# Patient Record
Sex: Male | Born: 1980 | Hispanic: Yes | Marital: Single | Smoking: Never smoker
Health system: Southern US, Community
[De-identification: ages and names within clinical notes are randomized; demographics above are authoritative.]

## PROBLEM LIST (undated history)

## (undated) DIAGNOSIS — K219 Gastro-esophageal reflux disease without esophagitis: Secondary | ICD-10-CM

## (undated) HISTORY — PX: FOOT SURGERY: SHX648

## (undated) HISTORY — PX: KNEE SURGERY: SHX244

---

## 2019-12-07 ENCOUNTER — Encounter: Payer: Self-pay | Admitting: *Deleted

## 2020-01-25 ENCOUNTER — Ambulatory Visit (INDEPENDENT_AMBULATORY_CARE_PROVIDER_SITE_OTHER): Payer: 59 | Admitting: Gastroenterology

## 2020-01-25 ENCOUNTER — Encounter (INDEPENDENT_AMBULATORY_CARE_PROVIDER_SITE_OTHER): Payer: Self-pay

## 2020-01-25 ENCOUNTER — Other Ambulatory Visit: Payer: Self-pay

## 2020-01-25 VITALS — BP 116/76 | HR 64 | Temp 97.8°F | Wt 178.6 lb

## 2020-01-25 DIAGNOSIS — R1310 Dysphagia, unspecified: Secondary | ICD-10-CM

## 2020-01-25 MED ORDER — OMEPRAZOLE 40 MG PO CPDR: 40.0000 mg | DELAYED_RELEASE_CAPSULE | Freq: Every day | ORAL | 0 refills | Status: AC

## 2020-01-25 NOTE — Progress Notes (Signed)
   Wyline Mood MD, MRCP(U.K) [address]  [address]  [place], [place] [address]  Main: [phone]  Fax: [phone]   Gastroenterology Consultation  Referring Provider:     Katherina Right, MD Primary Care Physician:  Katherina Right, MD Primary Gastroenterologist:  Dr. Wyline Mood  Reason for Consultation:     Dysphagia         HPI:   Donald Mcguire is a 39 y.o. y/o male referred for consultation & management  by Dr. Katherina Right, MD.    Here with an interpreter as he speaks only Spanish.  States that he has been having difficulty swallowing for over 2 years for solids more than liquids.  Points to the upper part of his chest where food gets stuck almost every time.  Denies any prior evaluation for the same.  Gradually got worse over the period of 2 years.  Denies any heartburn.  Not on any PPI.  No family history of esophageal cancer.  Not a smoker.  No past medical history on file.    Prior to Admission medications   Not on File    No family history on file.   Social History   Tobacco Use  . Smoking status: Not on file  Substance Use Topics  . Alcohol use: Not on file  . Drug use: Not on file    Allergies as of 01/25/2020  . (No Known Allergies)    Review of Systems:    All systems reviewed and negative except where noted in HPI.   Physical Exam:  BP 116/76   Pulse 64   Temp 97.8 F (36.6 C)   Wt 178 lb 9.6 oz (81 kg)  No LMP for male patient. Psych:  Alert and cooperative. Normal mood and affect. General:   Alert,  Well-developed, well-nourished, pleasant and cooperative in NAD Head:  Normocephalic and atraumatic. Eyes:  Sclera clear, no icterus.   Conjunctiva pink. Ears:  Normal auditory acuity. Lungs:  Respirations even and unlabored.  Clear throughout to auscultation.   No wheezes, crackles, or rhonchi. No acute distress. Heart:  Regular rate and rhythm; no murmurs, clicks, rubs, or gallops. Abdomen:  Normal bowel sounds.  No bruits.  Soft,  non-tender and non-distended without masses, hepatosplenomegaly or hernias noted.  No guarding or rebound tenderness.    Neurologic:  Alert and oriented x3;  grossly normal neurologically. Psych:  Alert and cooperative. Normal mood and affect.  Imaging Studies: No results found.  Assessment and Plan:   Donald Mcguire is a 39 y.o. y/o male has been referred for dysphagia.  Plan 1.  Commence on Prilosec temporarily treat and have the reflux 2.  EGD  I have discussed alternative options, risks & benefits,  which include, but are not limited to, bleeding, infection, perforation,respiratory complication & drug reaction.  The patient agrees with this plan & written consent will be obtained.     Follow up in 6-8 weeks  Dr Wyline Mood MD,MRCP(U.K)

## 2020-01-25 NOTE — Addendum Note (Signed)
Addended by: Daisy Blossom on: 01/25/2020 02:31 PM   Modules accepted: Orders

## 2020-01-29 ENCOUNTER — Other Ambulatory Visit
Admission: RE | Admit: 2020-01-29 | Discharge: 2020-01-29 | Disposition: A | Discharge status: Home / Self Care | Payer: 59 | Source: Ambulatory Visit | Attending: Gastroenterology | Admitting: Gastroenterology

## 2020-01-29 DIAGNOSIS — Z20822 Contact with and (suspected) exposure to covid-19: Secondary | ICD-10-CM | POA: Insufficient documentation

## 2020-01-29 DIAGNOSIS — Z01812 Encounter for preprocedural laboratory examination: Secondary | ICD-10-CM | POA: Diagnosis not present

## 2020-01-29 LAB — SARS CORONAVIRUS 2 (TAT 6-24 HRS): SARS Coronavirus 2: NEGATIVE

## 2020-02-02 ENCOUNTER — Encounter
Admission: RE | Disposition: A | Discharge status: Home / Self Care | Payer: Self-pay | Source: Home / Self Care | Attending: Gastroenterology

## 2020-02-02 ENCOUNTER — Ambulatory Visit: Payer: 59 | Admitting: Anesthesiology

## 2020-02-02 ENCOUNTER — Encounter: Payer: Self-pay | Admitting: Gastroenterology

## 2020-02-02 ENCOUNTER — Other Ambulatory Visit: Payer: Self-pay

## 2020-02-02 ENCOUNTER — Ambulatory Visit
Admission: RE | Admit: 2020-02-02 | Discharge: 2020-02-02 | Disposition: A | Discharge status: Home / Self Care | Payer: 59 | Attending: Gastroenterology | Admitting: Gastroenterology

## 2020-02-02 DIAGNOSIS — Z79899 Other long term (current) drug therapy: Secondary | ICD-10-CM | POA: Insufficient documentation

## 2020-02-02 DIAGNOSIS — R1310 Dysphagia, unspecified: Secondary | ICD-10-CM | POA: Diagnosis not present

## 2020-02-02 DIAGNOSIS — K219 Gastro-esophageal reflux disease without esophagitis: Secondary | ICD-10-CM | POA: Insufficient documentation

## 2020-02-02 DIAGNOSIS — K222 Esophageal obstruction: Secondary | ICD-10-CM | POA: Diagnosis not present

## 2020-02-02 DIAGNOSIS — K449 Diaphragmatic hernia without obstruction or gangrene: Secondary | ICD-10-CM | POA: Diagnosis not present

## 2020-02-02 HISTORY — PX: ESOPHAGOGASTRODUODENOSCOPY (EGD) WITH PROPOFOL: SHX5813

## 2020-02-02 HISTORY — DX: Gastro-esophageal reflux disease without esophagitis: K21.9

## 2020-02-02 SURGERY — ESOPHAGOGASTRODUODENOSCOPY (EGD) WITH PROPOFOL
Anesthesia: General

## 2020-02-02 MED ORDER — SODIUM CHLORIDE 0.9 % IV SOLN: INTRAVENOUS | Status: DC

## 2020-02-02 MED ORDER — PROPOFOL 10 MG/ML IV BOLUS
INTRAVENOUS | Status: DC | PRN
  Administered 2020-02-02: 50 mg via INTRAVENOUS
  Administered 2020-02-02: 60 mg via INTRAVENOUS
  Administered 2020-02-02: 150 ug/kg/min via INTRAVENOUS

## 2020-02-02 MED ORDER — PROPOFOL 500 MG/50ML IV EMUL
INTRAVENOUS | Status: AC
  Filled 2020-02-02: qty 50

## 2020-02-02 NOTE — Anesthesia Preprocedure Evaluation (Signed)
Anesthesia Evaluation  Patient identified by MRN, date of birth, ID band Patient awake    Reviewed: Allergy & Precautions, NPO status , Patient's Chart, lab work & pertinent test results  History of Anesthesia Complications Negative for: history of anesthetic complications  Airway Mallampati: II  TM Distance: >3 FB Neck ROM: Full    Dental  (+) Implants   Pulmonary neg pulmonary ROS, neg sleep apnea, neg COPD,    breath sounds clear to auscultation- rhonchi (-) wheezing      Cardiovascular Exercise Tolerance: Good (-) hypertension(-) CAD and (-) Past MI  Rhythm:Regular Rate:Normal - Systolic murmurs and - Diastolic murmurs    Neuro/Psych negative neurological ROS  negative psych ROS   GI/Hepatic Neg liver ROS, GERD  ,  Endo/Other  negative endocrine ROSneg diabetes  Renal/GU negative Renal ROS     Musculoskeletal negative musculoskeletal ROS (+)   Abdominal (+) - obese,   Peds  Hematology negative hematology ROS (+)   Anesthesia Other Findings   Reproductive/Obstetrics                             Anesthesia Physical Anesthesia Plan  ASA: II  Anesthesia Plan: General   Post-op Pain Management:    Induction: Intravenous  PONV Risk Score and Plan: 1 and Propofol infusion  Airway Management Planned: Natural Airway  Additional Equipment:   Intra-op Plan:   Post-operative Plan:   Informed Consent: I have reviewed the patients History and Physical, chart, labs and discussed the procedure including the risks, benefits and alternatives for the proposed anesthesia with the patient or authorized representative who has indicated his/her understanding and acceptance.     Dental advisory given  Plan Discussed with: CRNA and Anesthesiologist  Anesthesia Plan Comments:         Anesthesia Quick Evaluation

## 2020-02-02 NOTE — Transfer of Care (Signed)
Immediate Anesthesia Transfer of Care Note  Patient: Donald Mcguire  Procedure(s) Performed: ESOPHAGOGASTRODUODENOSCOPY (EGD) WITH PROPOFOL (N/A )  Patient Location: PACU  Anesthesia Type:General  Level of Consciousness: drowsy  Airway & Oxygen Therapy: Patient Spontanous Breathing  Post-op Assessment: Report given to RN and Post -op Vital signs reviewed and stable  Post vital signs: Reviewed and stable  Last Vitals:  Vitals Value Taken Time  BP 98/61 02/02/20 0942  Temp 36 C 02/02/20 0940  Pulse 85 02/02/20 0942  Resp 13 02/02/20 0942  SpO2 98 % 02/02/20 0942  Vitals shown include unvalidated device data.  Last Pain:  Vitals:   02/02/20 0940  TempSrc: Temporal         Complications: No apparent anesthesia complications

## 2020-02-02 NOTE — H&P (Signed)
Wyline Mood, MD [address], [address], [place], [place], [address] [address], [place], [place], [address] Phone: [phone]  Fax: [phone]  Primary Care Physician:  [facility]   Pre-Procedure History & Physical: HPI:  Donald Mcguire is a 39 y.o. male is here for an endoscopy    Past Medical History:  Diagnosis Date  . GERD (gastroesophageal reflux disease)     Past Surgical History:  Procedure Laterality Date  . FOOT SURGERY Left   . KNEE SURGERY Left     Prior to Admission medications   Medication Sig Start Date End Date Taking? Authorizing Provider  omeprazole (PRILOSEC) 40 MG capsule Take 1 capsule (40 mg total) by mouth daily. 01/25/20  Yes Wyline Mood, MD  famotidine (PEPCID) 20 MG tablet Take 20 mg by mouth daily.    [provider]    Allergies as of 01/26/2020  . (No Known Allergies)    History reviewed. No pertinent family history.  Social History   Socioeconomic History  . Marital status: Single    Spouse name: Not on file  . Number of children: Not on file  . Years of education: Not on file  . Highest education level: Not on file  Occupational History  . Not on file  Tobacco Use  . Smoking status: Never Smoker  . Smokeless tobacco: Never Used  Substance and Sexual Activity  . Alcohol use: Yes    Comment: none last 24 hrs  . Drug use: Never  . Sexual activity: Not on file  Other Topics Concern  . Not on file  Social History Narrative  . Not on file   Social Determinants of Health   Financial Resource Strain:   . Difficulty of Paying Living Expenses:   Food Insecurity:   . Worried About Programme researcher, broadcasting/film/video in the Last Year:   . Barista in the Last Year:   Transportation Needs:   . Freight forwarder (Medical):   Marland Kitchen Lack of Transportation (Non-Medical):   Physical Activity:   . Days of Exercise per Week:   . Minutes of Exercise per Session:   Stress:   . Feeling of  Stress :   Social Connections:   . Frequency of Communication with Friends and Family:   . Frequency of Social Gatherings with Friends and Family:   . Attends Religious Services:   . Active Member of Clubs or Organizations:   . Attends Banker Meetings:   Marland Kitchen Marital Status:   Intimate Partner Violence:   . Fear of Current or Ex-Partner:   . Emotionally Abused:   Marland Kitchen Physically Abused:   . Sexually Abused:     Review of Systems: See HPI, otherwise negative ROS  Physical Exam: BP 121/84   Pulse 72   Temp (!) 96.8 F (36 C) (Temporal)   Resp 18   Ht 5\' 5"  (1.651 m)   Wt 79.4 kg   SpO2 100%   BMI 29.12 kg/m  General:   Alert,  pleasant and cooperative in NAD Head:  Normocephalic and atraumatic. Neck:  Supple; no masses or thyromegaly. Lungs:  Clear throughout to auscultation, normal respiratory effort.    Heart:  +S1, +S2, Regular rate and rhythm, No edema. Abdomen:  Soft, nontender and nondistended. Normal bowel sounds, without guarding, and without rebound.   Neurologic:  Alert and  oriented x4;  grossly normal neurologically.  Impression/Plan: Donald Mcguire is here for an  endoscopy  to be performed for  evaluation of dysphagia    Risks, benefits, limitations, and alternatives regarding endoscopy have been reviewed with the patient.  Questions have been answered.  All parties agreeable.   Jonathon Bellows, MD  02/02/2020, 9:13 AM

## 2020-02-02 NOTE — Op Note (Signed)
[facility] Gastroenterology Patient Name: Donald Mcguire Procedure Date: 02/02/2020 9:22 AM MRN: [id number] Account #: [id number] Date of Birth: 1981/07/12 Admit Type: Outpatient Age: 39 Room: [facility] ENDO ROOM 1 Gender: Male Note Status: Finalized Procedure:             Upper GI endoscopy Indications:           Dysphagia Providers:             Wyline Mood MD, MD Medicines:             Monitored Anesthesia Care Complications:         No immediate complications. Procedure:             Pre-Anesthesia Assessment:                        - Prior to the procedure, a History and Physical was                         performed, and patient medications, allergies and                         sensitivities were reviewed. The patient's tolerance                         of previous anesthesia was reviewed.                        - The risks and benefits of the procedure and the                         sedation options and risks were discussed with the                         patient. All questions were answered and informed                         consent was obtained.                        - ASA Grade Assessment: II - A patient with mild                         systemic disease.                        After obtaining informed consent, the endoscope was                         passed under direct vision. Throughout the procedure,                         the patient's blood pressure, pulse, and oxygen                         saturations were monitored continuously. The Endoscope                         was introduced through the mouth, and advanced to the  third part of duodenum. The upper GI endoscopy was                         accomplished with ease. The patient tolerated the                         procedure well. Findings:      The examined duodenum was normal.      A medium-sized hiatal hernia was present.      A non-obstructing Schatzki ring was  found at the gastroesophageal       junction. A TTS dilator was passed through the scope. Dilation with a       15-16.5-18 mm balloon dilator was performed to 18 mm. The dilation site       was examined and showed no change. This was biopsied with a cold forceps       for evaluation of eosinophilic esophagitis. Impression:            - Normal examined duodenum.                        - Medium-sized hiatal hernia.                        - Non-obstructing Schatzki ring. Dilated. Biopsied. Recommendation:        - Discharge patient to home (with escort).                        - Resume previous diet.                        - Continue present medications.                        - Await pathology results.                        - Return to my office as previously scheduled. Procedure Code(s):     --- Professional ---                        [phone], Esophagogastroduodenoscopy, flexible,                         transoral; with transendoscopic balloon dilation of                         esophagus (less than 30 mm diameter)                        43239, 59, Esophagogastroduodenoscopy, flexible,                         transoral; with biopsy, single or multiple Diagnosis Code(s):     --- Professional ---                        K44.9, Diaphragmatic hernia without obstruction or                         gangrene  K22.2, Esophageal obstruction                        R13.10, Dysphagia, unspecified CPT copyright 2019 American Medical Association. All rights reserved. The codes documented in this report are preliminary and upon coder review may  be revised to meet current compliance requirements. Wyline Mood, MD Wyline Mood MD, MD 02/02/2020 9:41:53 AM This report has been signed electronically. Number of Addenda: 0 Note Initiated On: 02/02/2020 9:22 AM Estimated Blood Loss:  Estimated blood loss: none.      [facility]

## 2020-02-02 NOTE — Anesthesia Postprocedure Evaluation (Signed)
Anesthesia Post Note  Patient: Juanito Gonyer  Procedure(s) Performed: ESOPHAGOGASTRODUODENOSCOPY (EGD) WITH PROPOFOL (N/A )  Patient location during evaluation: Endoscopy Anesthesia Type: General Level of consciousness: awake and alert and oriented Pain management: pain level controlled Vital Signs Assessment: post-procedure vital signs reviewed and stable Respiratory status: spontaneous breathing, nonlabored ventilation and respiratory function stable Cardiovascular status: blood pressure returned to baseline and stable Postop Assessment: no signs of nausea or vomiting Anesthetic complications: no     Last Vitals:  Vitals:   02/02/20 0950 02/02/20 1000  BP: 114/72 114/72  Pulse: 80 63  Resp: 18 18  Temp:    SpO2: 100% 97%    Last Pain:  Vitals:   02/02/20 0940  TempSrc: Temporal                 Maven Rosander

## 2020-02-03 ENCOUNTER — Encounter: Payer: Self-pay | Admitting: *Deleted

## 2020-02-03 LAB — SURGICAL PATHOLOGY

## 2020-02-04 ENCOUNTER — Encounter: Payer: Self-pay | Admitting: Gastroenterology

## 2020-03-14 ENCOUNTER — Ambulatory Visit (INDEPENDENT_AMBULATORY_CARE_PROVIDER_SITE_OTHER): Payer: 59 | Admitting: Gastroenterology

## 2020-03-14 ENCOUNTER — Other Ambulatory Visit: Payer: Self-pay

## 2020-03-14 VITALS — BP 132/79 | HR 72 | Temp 98.1°F | Ht 65.0 in | Wt 182.0 lb

## 2020-03-14 DIAGNOSIS — K449 Diaphragmatic hernia without obstruction or gangrene: Secondary | ICD-10-CM | POA: Diagnosis not present

## 2020-03-14 DIAGNOSIS — R1310 Dysphagia, unspecified: Secondary | ICD-10-CM | POA: Diagnosis not present

## 2020-03-14 NOTE — Patient Instructions (Signed)
Acidez estomacal Heartburn La acidez estomacal es un tipo de dolor o Tree surgeon que puede sentirse en la garganta o en el pecho. Con frecuencia se describe como un dolor urente (ardor). Tambin puede causar mal sabor en la boca que se siente cido. La acidez estomacal puede empeorar al acostarse o al inclinarse. Puede ser peor por la noche. Puede ser ocasionada porque el contenido del estmago vuelve hacia arriba (reflujo) por el tubo que conecta la boca con el estmago (esfago). Siga estas indicaciones en su casa: Comida y bebida   Evite determinados alimentos y bebidas como se lo haya indicado el mdico. Estos pueden incluir: ? Caf y t (con o sin cafena). ? Bebidas que contengan alcohol. ? Bebidas energticas y deportivas. ? Bebidas gaseosas o refrescos. ? Chocolate y cacao. ? Menta y [place]. ? Ajo y cebolla. ? Rbano picante. ? Alimentos cidos y condimentados, tales como:  Pimientos.  [place] en polvo y curry en polvo.  Vinagre.  Salsas picantes y [employer]. ? Los ctricos y sus jugos, tales como:  [place].  Limones.  Limas. ? Alimentos a base de tomate, tales como:  Salsa roja y pizza con salsa roja.  [place].  Salsa. ? Alimentos fritos y Research officer, political party, tales como:  [place].  Papas fritas y papas fritas de bolsa.  Aderezos con alto contenido de Lobbyist. ? Carnes con alto contenido de [place], tales como:  Perros calientes y Engineer, petroleum.  Chuletas.  Jamn y tocino. ? Productos lcteos con alto contenido de [place], tales como:  Leche entera.  Malabar.  Queso crema.  Consuma pequeas cantidades de comida con ms frecuencia. Evite consumir porciones abundantes.  Evite beber grandes cantidades de lquidos con las comidas.  Evite comer 2 o 3horas antes de acostarse.  Evite recostarse inmediatamente despus de comer.  No haga ejercicios enseguida despus de comer. Estilo de vida      Si tiene sobrepeso, baje una cantidad de peso  saludable para usted. Consulte a su mdico para bajar de peso de [employer].  No consuma ningn producto que contenga nicotina o tabaco, lo que incluye cigarrillos, cigarrillos electrnicos y tabaco de Higher education careers adviser. Estos pueden empeorar los sntomas. Si necesita ayuda para dejar de fumar, consulte al mdico.  Use ropa holgada. No use nada apretado alrededor de la cintura.  Levante (eleve) la cabecera de la cama aproximadamente 6pulgadas (15cm) para dormir.  Intente reducir Schering[employer] de estrs. Si necesita ayuda para hacer esto, consulte al mdico. Indicaciones generales  Est atento a cualquier cambio en los sntomas.  Tome los medicamentos de venta libre y los recetados solamente como se lo haya indicado el mdico. ? No tome aspirina, ibuprofeno ni otros antiinflamatorios no esteroideos (AINE) a menos que el mdico lo autorice. ? Deje de tomar medicamentos solamente como se lo haya indicado el mdico.  Concurra a todas las visitas de seguimiento como se lo haya indicado el mdico. Esto es importante. Comunquese con un mdico si:  Aparecen nuevos sntomas.  Adelgaza y no sabe por qu est sucediendo esto.  Tiene problemas para tragar, o le duele cuando traga.  Tiene sibilancia o tos persistente.  Los sntomas no mejoran con Dispensing optician.  Tiene acidez estomacal con frecuencia durante ms de 2semanas. Solicite ayuda inmediatamente si:  D[employer] Corporation, el cuello, la [place], los dientes o la espalda.  Se siente transpirado, mareado o tiene una sensacin de desvanecimiento.  Siente falta de aire o Tourist information centre manager.  Vomita, y el vmito  tiene un aspecto similar a la sangre o a los posos de caf.  Las deposiciones (heces) son sanguinolentas o negras. Estos sntomas pueden representar un problema grave que constituye Radio broadcast assistant. No espere a ver si los sntomas desaparecen. Solicite atencin mdica de inmediato. Comunquese con el servicio de emergencias de su  localidad (911 en los [place]). No conduzca por sus propios medios [employer] hospital. Resumen  La acidez estomacal es un tipo de dolor que puede sentirse en la garganta o en el pecho. Puede sentirse como un dolor urente (ardor). Tambin puede causar mal sabor en la boca que se siente cido.  Es posible que deba evitar ciertos alimentos y bebidas para ayudar a Paramedic los sntomas. Pregntele al mdico qu alimentos y bebidas Personnel officer.  Tome los medicamentos de venta libre y los recetados solamente como se lo haya indicado el mdico. No tome aspirina, ibuprofeno ni otros antiinflamatorios no esteroideos (AINE) a menos que el mdico se lo haya indicado.  Comunquese con el mdico si los sntomas no mejoran o si empeoran. Esta informacin no tiene Theme park manager el consejo del mdico. Asegrese de hacerle al mdico cualquier pregunta que tenga. Document Revised: 04/03/2018 Document Reviewed: 04/03/2018 Elsevier Patient Education  2020 [employer].

## 2020-03-14 NOTE — Progress Notes (Signed)
   Wyline Mood MD, MRCP(U.K) [address]  [address]  [place], [place] [address]  Main: [phone]  Fax: [phone]   Primary Care Physician: [facility]  Primary Gastroenterologist:  Dr. Wyline Mood   Dysphagia follow up   HPI: Donald Mcguire is a 39 y.o. male    Summary of history :  Initially referred and seen in April 2021 for dysphagia.  Ongoing for 2 years for solids more than liquids.  He was not on any PPI at that time.  Interval history   01/25/2020-03/14/2020  02/02/2020: EGD: Nonobstructing Schatzki's ring was seen at the GE junction.  Empirically dilated to 18 mm and no significant change noted.  Biopsies of the esophagus were taken.  Medium size hiatal hernia noted.  Last visit was commenced on Prilosec  Since his last visit he states the dysphagia has resolved after the dilation.  Has occasional left-sided abdominal cramping sometimes before eating sometimes after eating.  Visit accomplished via live interpreter in the room Current Outpatient Medications  Medication Sig Dispense Refill  . famotidine (PEPCID) 20 MG tablet Take 20 mg by mouth daily.    Marland Kitchen omeprazole (PRILOSEC) 40 MG capsule Take 1 capsule (40 mg total) by mouth daily. 90 capsule 0   No current facility-administered medications for this visit.    Allergies as of 03/14/2020  . (No Known Allergies)    ROS:  General: Negative for anorexia, weight loss, fever, chills, fatigue, weakness. ENT: Negative for hoarseness, difficulty swallowing , nasal congestion. CV: Negative for chest pain, angina, palpitations, dyspnea on exertion, peripheral edema.  Respiratory: Negative for dyspnea at rest, dyspnea on exertion, cough, sputum, wheezing.  GI: See history of present illness. GU:  Negative for dysuria, hematuria, urinary incontinence, urinary frequency, nocturnal urination.  Endo: Negative for unusual weight change.    Physical Examination:   There were no vitals taken for  this visit.  General: Well-nourished, well-developed in no acute distress.  Eyes: No icterus. Conjunctivae pink. Neuro: Alert and oriented x 3.  Grossly intact. Skin: Warm and dry, no jaundice.   Psych: Alert and cooperative, normal mood and affect.   Imaging Studies: No results found.  Assessment and Plan:   Donald Mcguire is a 39 y.o. y/o male here to follow-up for dysphagia.  Schatzki's ring dilated to 18 mm on 02/02/2020.  Dysphagia has resolved.  Discussed that definitive treatment would involve surgery to treat the hiatal hernia to prevent chronic acid reflux.  Then there is a chance he can go off of the PPI.  Provided him information and will discuss further at next visit.  Plan 1.  Patient information in Spanish for acid reflux lifestyle changes 2.  Patient information on hiatal hernia in Spanish 3.  Trial of IBgard.    Dr Wyline Mood  MD,MRCP [facility]) Follow up in 6 months

## 2021-10-27 ENCOUNTER — Other Ambulatory Visit: Payer: Self-pay | Admitting: Obstetrics and Gynecology

## 2021-10-27 DIAGNOSIS — R7303 Prediabetes: Secondary | ICD-10-CM

## 2021-10-27 DIAGNOSIS — R1012 Left upper quadrant pain: Secondary | ICD-10-CM

## 2021-11-07 ENCOUNTER — Ambulatory Visit
Admission: RE | Admit: 2021-11-07 | Discharge: 2021-11-07 | Disposition: A | Discharge status: Home / Self Care | Payer: BC Managed Care – PPO | Source: Ambulatory Visit | Attending: Obstetrics and Gynecology | Admitting: Obstetrics and Gynecology

## 2021-11-07 DIAGNOSIS — R7303 Prediabetes: Secondary | ICD-10-CM

## 2021-11-07 DIAGNOSIS — R1012 Left upper quadrant pain: Secondary | ICD-10-CM

## 2022-03-02 ENCOUNTER — Other Ambulatory Visit: Payer: Self-pay | Admitting: General Surgery

## 2022-03-02 DIAGNOSIS — R109 Unspecified abdominal pain: Secondary | ICD-10-CM

## 2022-04-24 ENCOUNTER — Ambulatory Visit
Admission: RE | Admit: 2022-04-24 | Discharge: 2022-04-24 | Disposition: A | Discharge status: Home / Self Care | Payer: BC Managed Care – PPO | Source: Ambulatory Visit | Attending: General Surgery | Admitting: General Surgery

## 2022-04-24 DIAGNOSIS — R109 Unspecified abdominal pain: Secondary | ICD-10-CM | POA: Insufficient documentation

## 2022-04-24 MED ORDER — IOHEXOL 300 MG/ML  SOLN
100.0000 mL | Freq: Once | INTRAMUSCULAR | Status: AC | PRN
  Administered 2022-04-24: 100 mL via INTRAVENOUS

## 2023-08-04 IMAGING — US US ABDOMEN COMPLETE
1 series · 14 of 25 positions shown · non-contrast
Comparison: None.

CLINICAL DATA: Left upper quadrant pain

EXAM:
ABDOMEN ULTRASOUND COMPLETE

[Series 1: us abdomen complete · 14 of 133 slices shown]
[im 1/133]
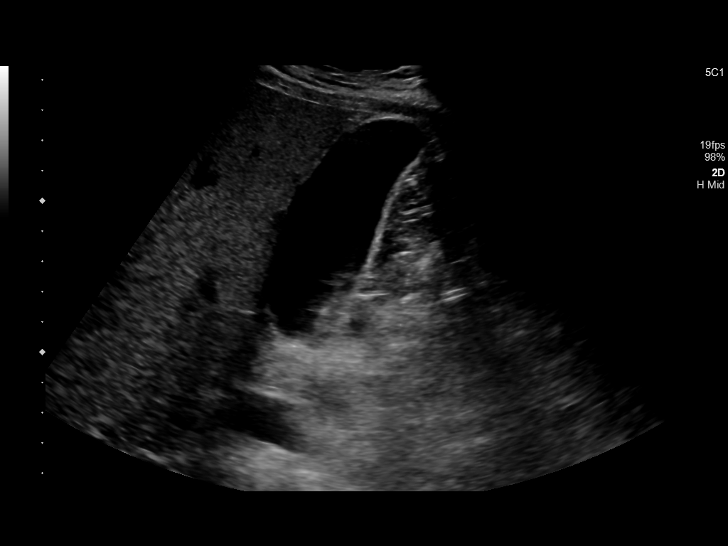
[im 12/133]
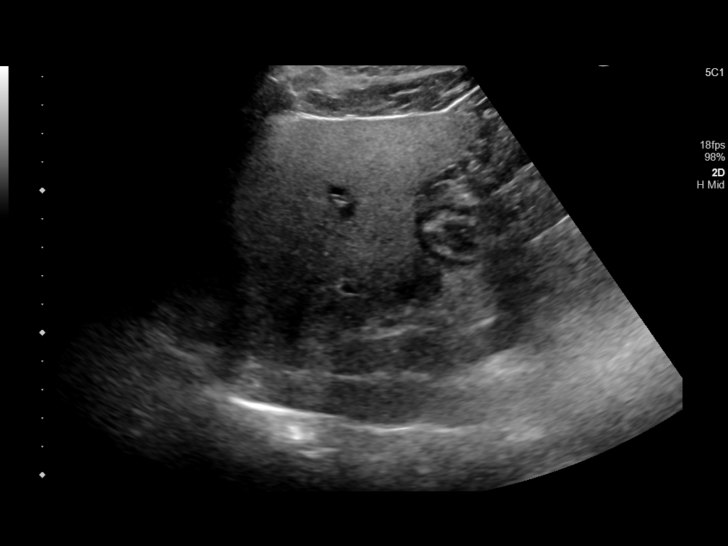
[im 23/133]
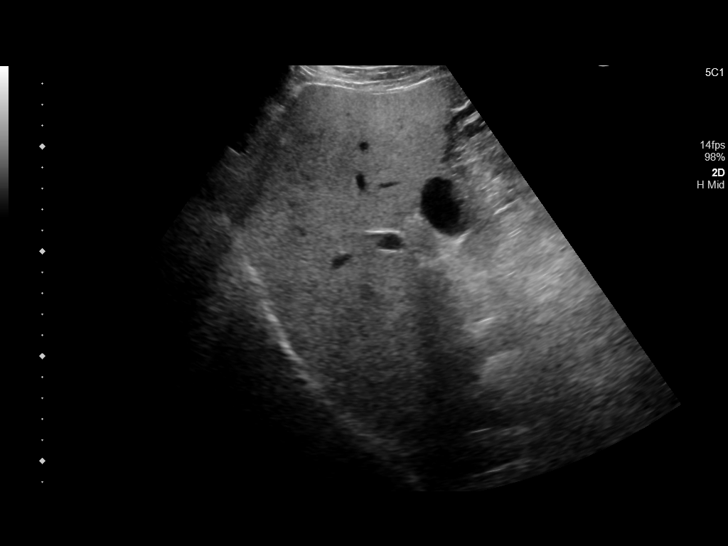
[im 34/133]
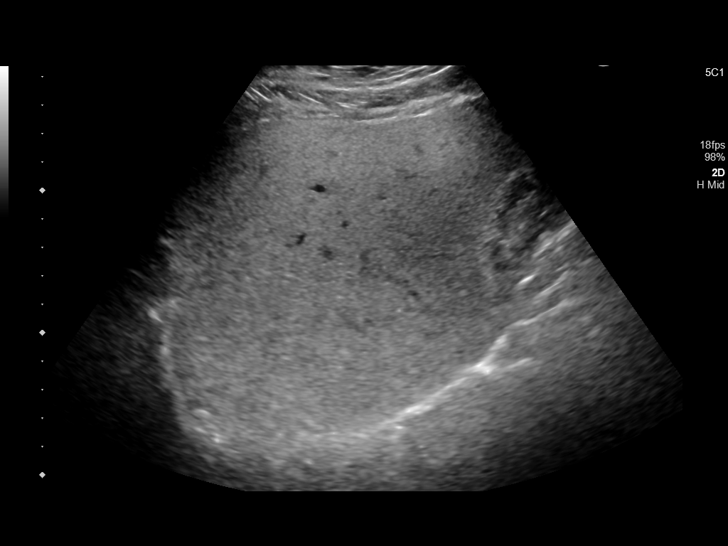
[im 45/133]
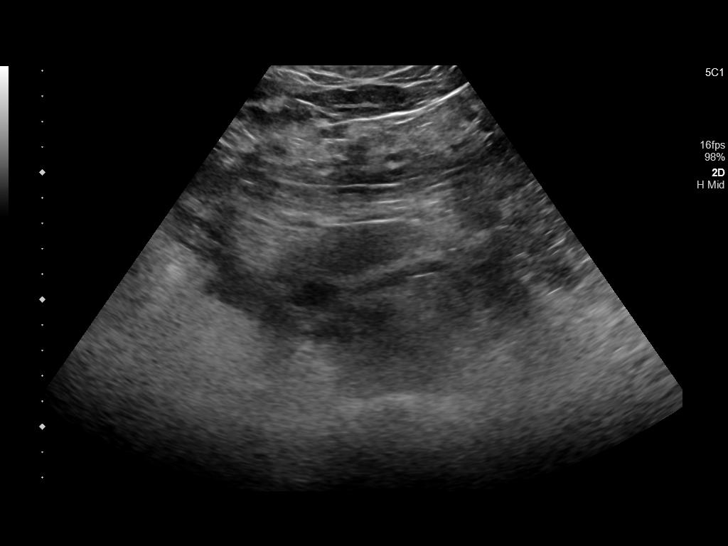
[im 50/133]
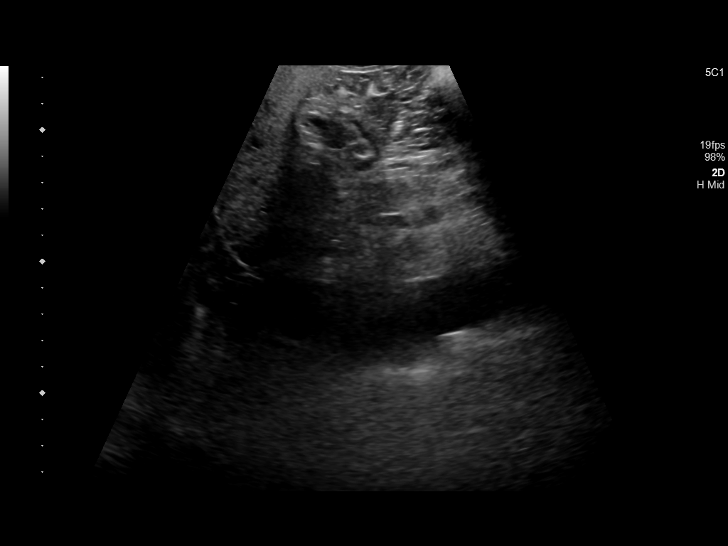
[im 61/133]
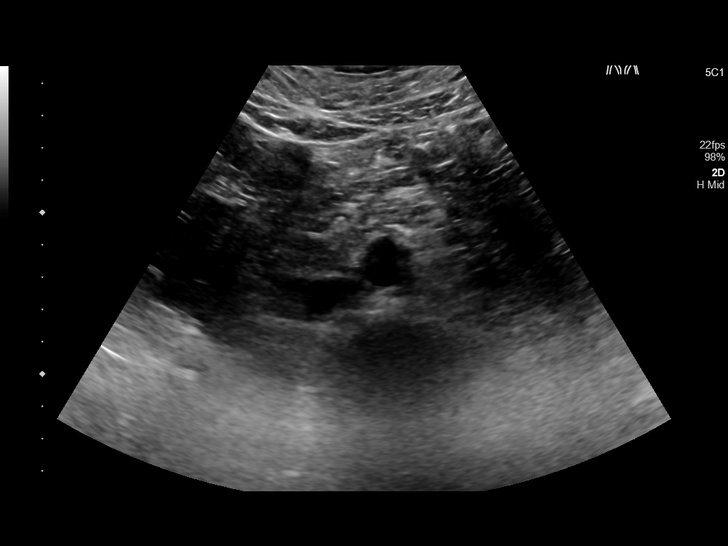
[im 72/133]
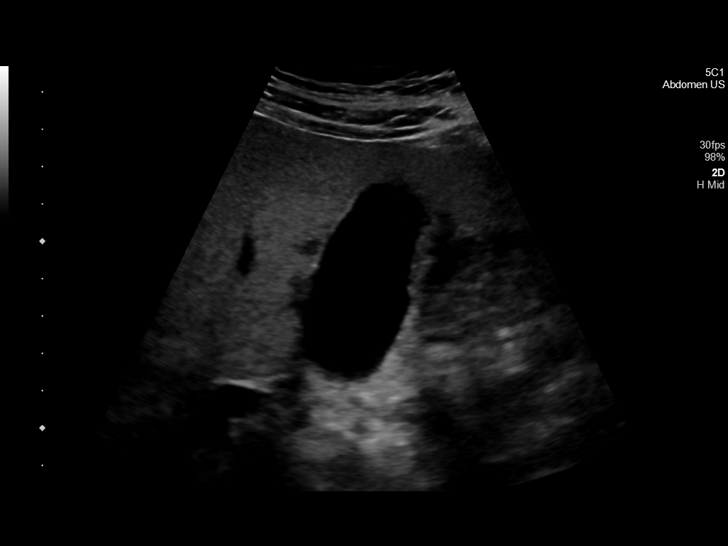
[im 83/133]
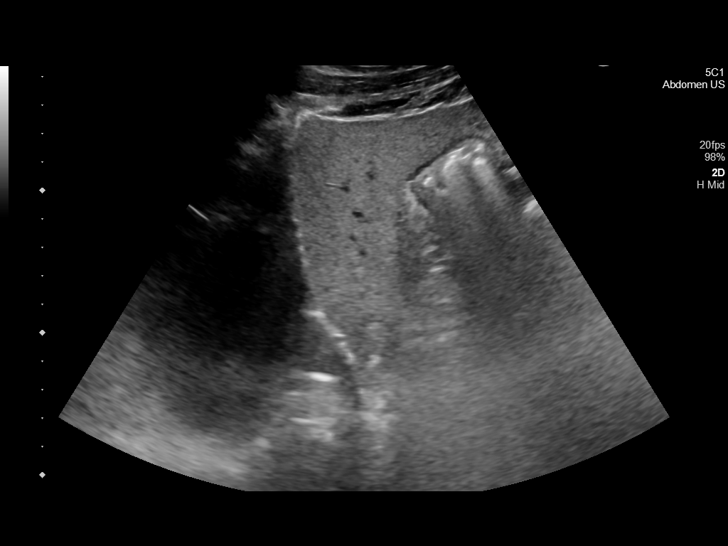
[im 89/133]
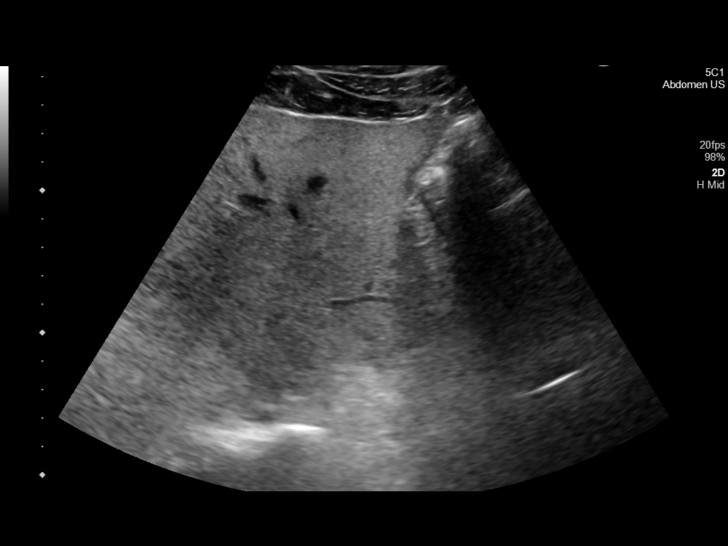
[im 100/133]
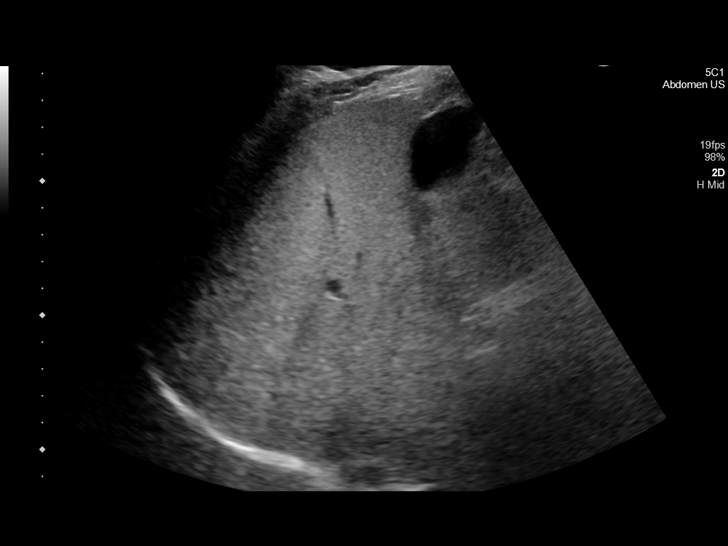
[im 111/133]
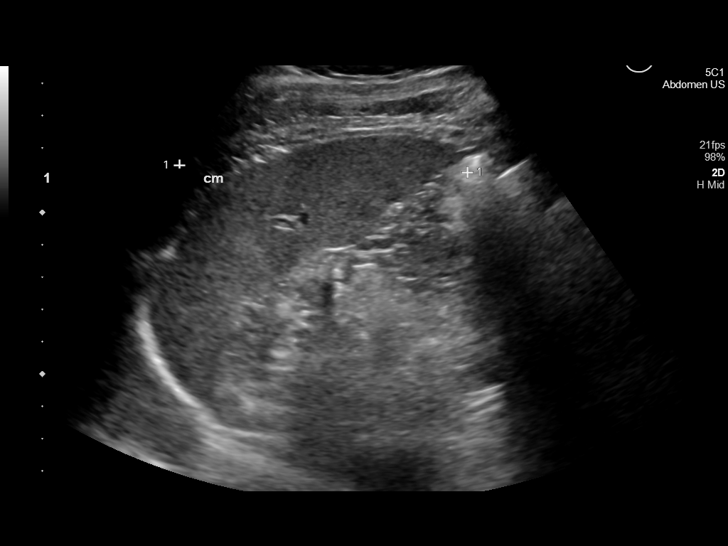
[im 122/133]
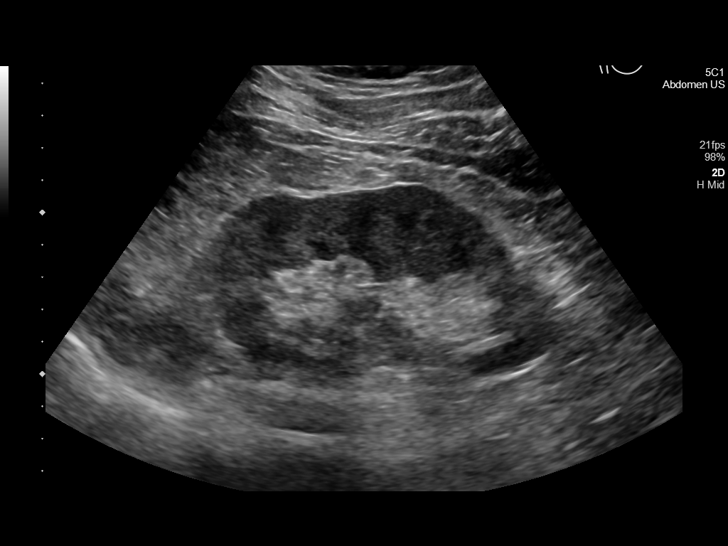
[im 133/133]
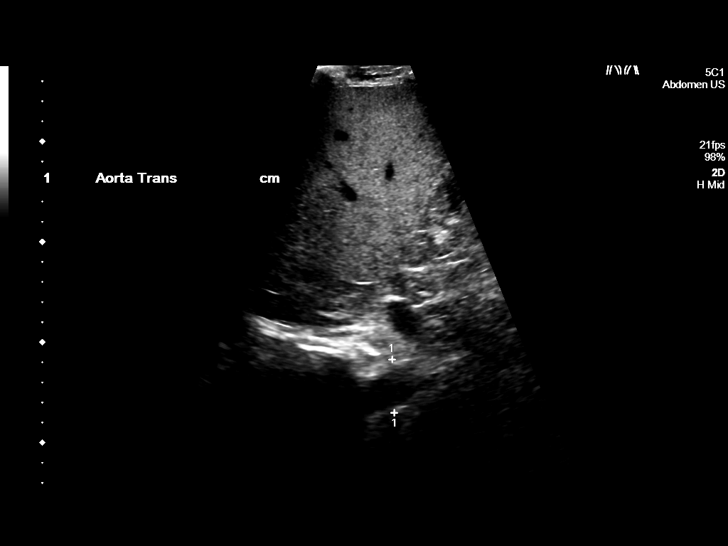

[14 of 25 positions shown; findings below may reference images not displayed]

FINDINGS: Gallbladder: No gallstones or wall thickening visualized. No
sonographic Murphy sign noted by sonographer.

Common bile duct: Diameter: 2 mm

Liver: Loosely increased in echogenicity consistent with fatty
infiltration. No focal mass is noted. Portal vein is patent on color
Doppler imaging with normal direction of blood flow towards the
liver.

IVC: No abnormality visualized.

Pancreas: Visualized portion unremarkable.

Spleen: Size and appearance within normal limits.

Right Kidney: Length: 10.0 cm. Echogenicity within normal limits.
No mass or hydronephrosis visualized.

Left Kidney: Length: 11.5 cm. Echogenicity within normal limits. No
mass or hydronephrosis visualized.

Abdominal aorta: No aneurysm visualized.

Other findings: None.
IMPRESSION: Fatty liver.  No other focal abnormality is noted.
